# Patient Record
Sex: Female | Born: 1995 | Race: White | Hispanic: No | Marital: Single | Smoking: Never smoker
Health system: Southern US, Community
[De-identification: ages and names within clinical notes are randomized; demographics above are authoritative.]

## PROBLEM LIST (undated history)

## (undated) DIAGNOSIS — J45909 Unspecified asthma, uncomplicated: Secondary | ICD-10-CM

## (undated) HISTORY — PX: TONSILLECTOMY: SUR1361

## (undated) HISTORY — PX: BUNIONECTOMY: SHX129

---
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
## 2018-02-07 IMAGING — DX DG CHEST 2V
2 series · 2 of 2 positions shown · non-contrast
Comparison: None.

CLINICAL DATA: 20-year-old female with a history of chest pain and
wheezing

EXAM:
CHEST  2 VIEW

[w chest pa]
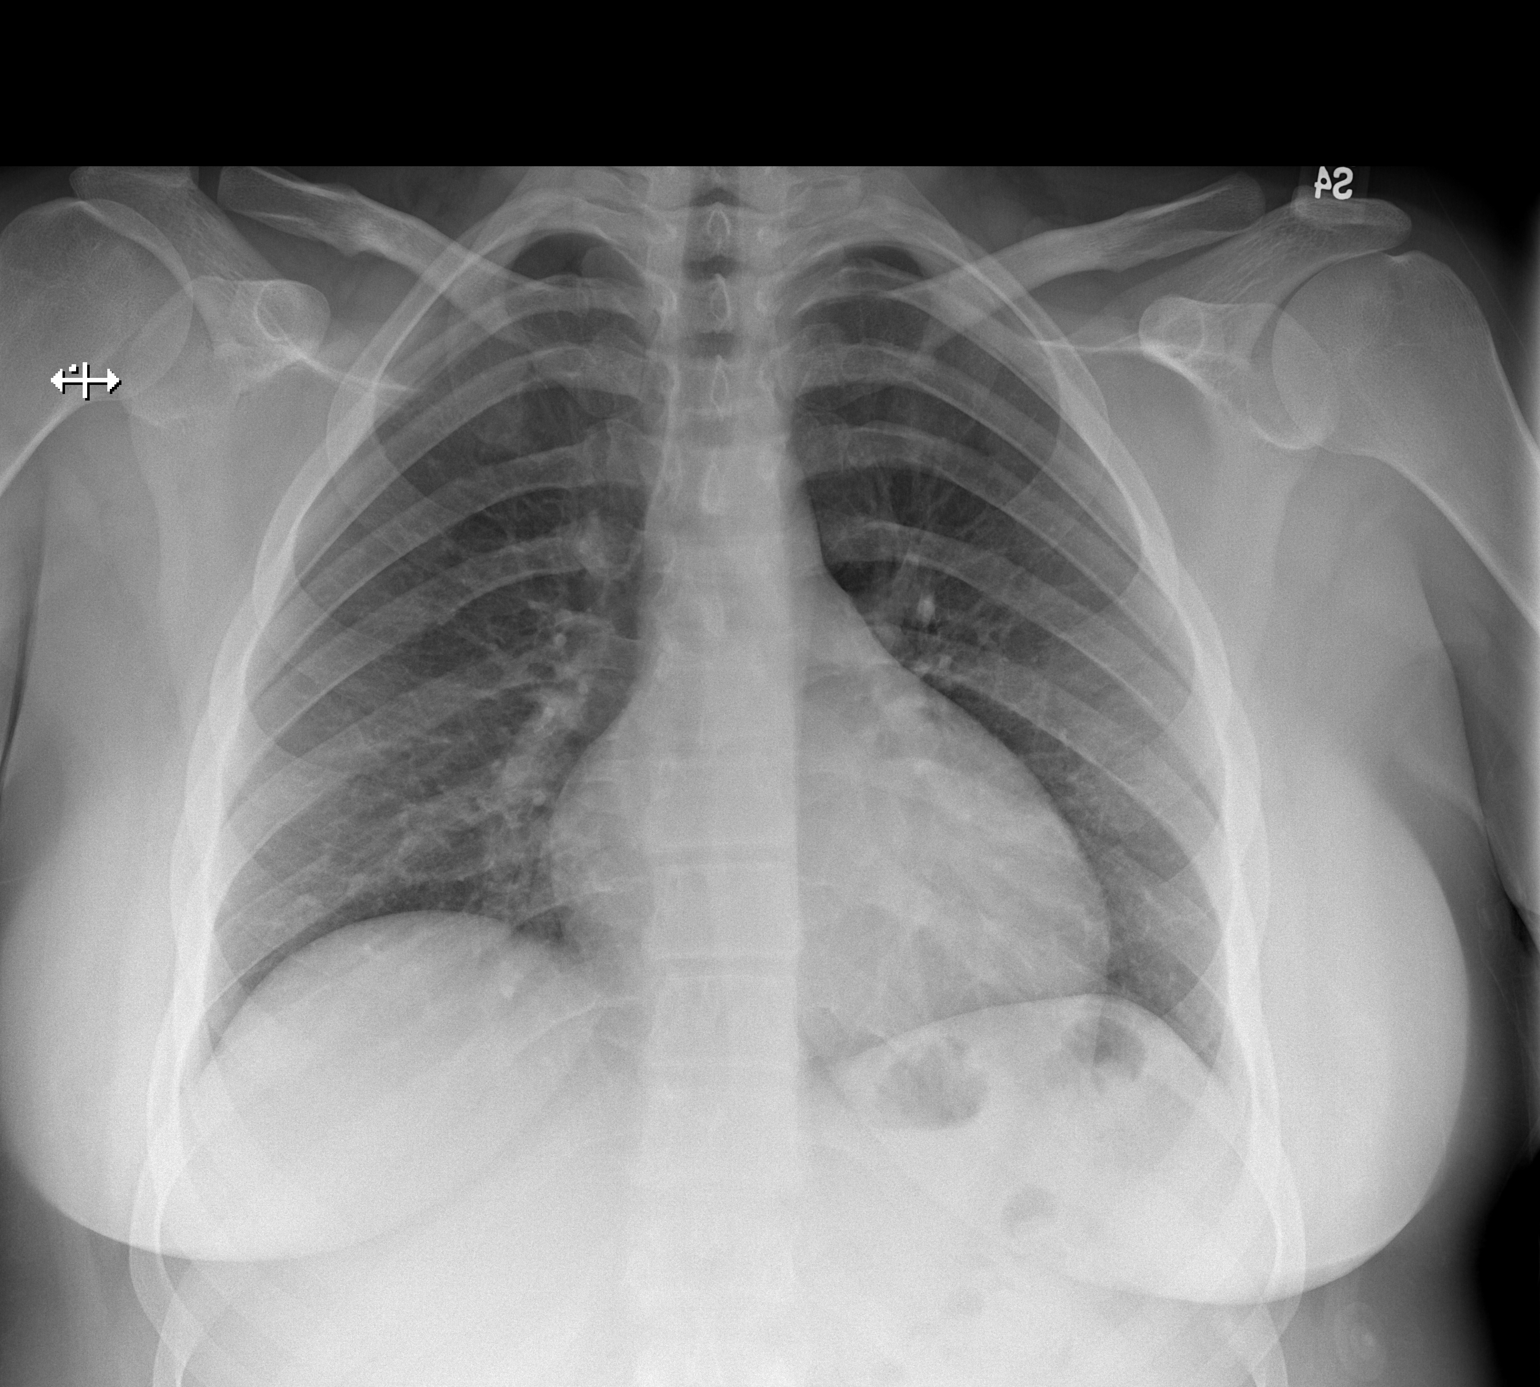

[w chest lat]
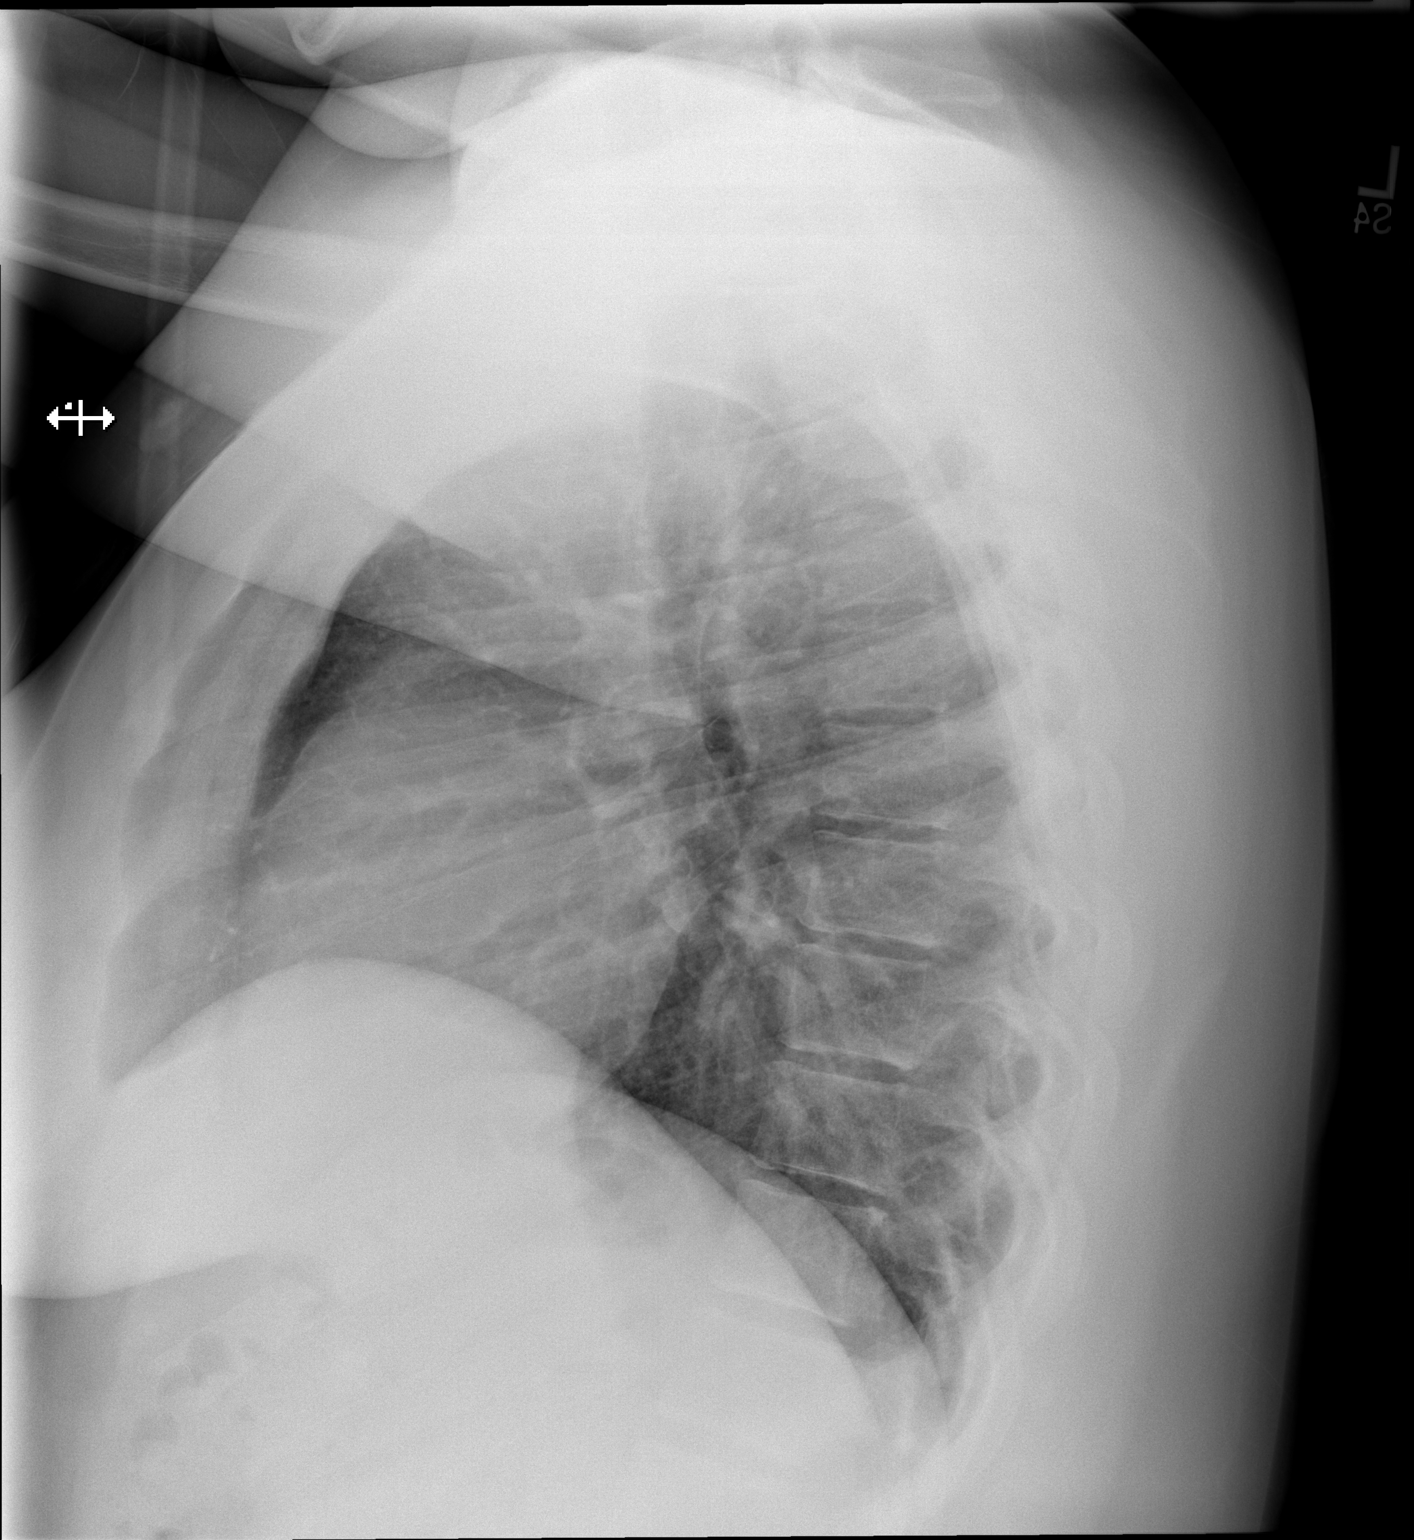

[2 of 2 positions shown; findings below may reference images not displayed]

FINDINGS: The heart size and mediastinal contours are within normal limits.
Both lungs are clear. The visualized skeletal structures are
unremarkable.
IMPRESSION: No active cardiopulmonary disease.
